# Patient Record
Sex: Female | Born: 1935 | Race: White | Hispanic: No | Smoking: Former smoker
Health system: Southern US, Community
[De-identification: ages and names within clinical notes are randomized; demographics above are authoritative.]

## PROBLEM LIST (undated history)

## (undated) DIAGNOSIS — J449 Chronic obstructive pulmonary disease, unspecified: Secondary | ICD-10-CM

## (undated) DIAGNOSIS — I10 Essential (primary) hypertension: Secondary | ICD-10-CM

## (undated) DIAGNOSIS — M1990 Unspecified osteoarthritis, unspecified site: Secondary | ICD-10-CM

## (undated) HISTORY — PX: ABDOMINAL HYSTERECTOMY: SHX81

---

## 2009-05-28 ENCOUNTER — Ambulatory Visit: Payer: Self-pay | Admitting: Family Medicine

## 2009-11-25 ENCOUNTER — Ambulatory Visit: Payer: Self-pay | Admitting: Diagnostic Radiology

## 2009-11-25 ENCOUNTER — Emergency Department (HOSPITAL_BASED_OUTPATIENT_CLINIC_OR_DEPARTMENT_OTHER): Admission: EM | Admit: 2009-11-25 | Discharge: 2009-11-25 | Payer: Self-pay | Admitting: Emergency Medicine

## 2009-11-25 ENCOUNTER — Ambulatory Visit: Payer: Self-pay | Admitting: Family Medicine

## 2009-11-25 DIAGNOSIS — I749 Embolism and thrombosis of unspecified artery: Secondary | ICD-10-CM | POA: Insufficient documentation

## 2009-11-25 DIAGNOSIS — I10 Essential (primary) hypertension: Secondary | ICD-10-CM | POA: Insufficient documentation

## 2009-11-26 ENCOUNTER — Encounter: Payer: Self-pay | Admitting: Family Medicine

## 2010-10-17 NOTE — Letter (Signed)
Summary: Internal Other  Internal Other   Imported By: Joanne Chars CMA 11/26/2009 12:52:45  _____________________________________________________________________  External Attachment:    Type:   Image     Comment:   External Document

## 2010-10-17 NOTE — Assessment & Plan Note (Signed)
Summary: VISIT/KH   Vital Signs:  Patient Profile:   77 Years Old Female CC:      vein on right leg edematous, "heavy,  red and warm X 2 days Height:     64.5 inches Weight:      164 pounds O2 Sat:      94 % O2 treatment:    Room Air Temp:     98.4 degrees F oral Pulse rate:   82 / minute Pulse rhythm:   regular Resp:     14 per minute BP sitting:   158 / 91  (right arm) Cuff size:   regular  Pt. in pain?   yes    Location:   right shin    Intensity:   5    Type:       heaviness  Vitals Entered By: Lajean Saver RN (November 25, 2009 10:23 AM)                   Updated Prior Medication List: LEXAPRO 10 MG TABS (ESCITALOPRAM OXALATE) as directed OMEPRAZOLE 20 MG CPDR (OMEPRAZOLE) as directed AMLODIPINE BESYLATE 5 MG TABS (AMLODIPINE BESYLATE) as directed HYDROCODONE-ACETAMINOPHEN 5-500 MG TABS (HYDROCODONE-ACETAMINOPHEN) prn * PROXICAN 10MG  once daily  Current Allergies (reviewed today): ! CODEINE ! LEVAQUINHistory of Present Illness Chief Complaint: vein on right leg edematous, "heavy,  red and warm X 2 days History of Present Illness: Subjective:  Patient complains of onset of swelling, heat, pain, and redness over her right lower leg pre-tibial area about 3 days ago.  The area has gradually become more painful.  No recent injury. No fever.  No chest pain or shortness of breath   REVIEW OF SYSTEMS Constitutional Symptoms      Denies fever, chills, night sweats, weight loss, weight gain, and fatigue.  Eyes       Denies change in vision, eye pain, eye discharge, glasses, contact lenses, and eye surgery. Ear/Nose/Throat/Mouth       Denies hearing loss/aids, change in hearing, ear pain, ear discharge, dizziness, frequent runny nose, frequent nose bleeds, sinus problems, sore throat, hoarseness, and tooth pain or bleeding.  Respiratory       Denies dry cough, productive cough, wheezing, shortness of breath, asthma, bronchitis, and emphysema/COPD.  Cardiovascular  Denies murmurs, chest pain, and tires easily with exhertion.    Gastrointestinal       Denies stomach pain, nausea/vomiting, diarrhea, constipation, blood in bowel movements, and indigestion. Genitourniary       Denies painful urination, kidney stones, and loss of urinary control. Neurological       Denies paralysis, seizures, and fainting/blackouts. Musculoskeletal       Complains of redness and swelling.      Denies muscle pain, joint pain, joint stiffness, decreased range of motion, muscle weakness, and gout.      Comments: right shin Skin       Denies bruising, unusual mles/lumps or sores, and hair/skin or nail changes.  Psych       Denies mood changes, temper/anger issues, anxiety/stress, speech problems, depression, and sleep problems. Other Comments: edematous and warm vein to right shin, sore and heavy   Past History:  Past Medical History:  Hypertension  Family History: elevated blood pressure Family History Hypertension  Social History: Alcohol use-no Drug use-no tobacco use - no, smoker X 15 years, quit 35 years ago   Objective:  No acute distress  Lungs:  Clear to auscultation.  Breath sounds are equal.  Heart:  Regular  rate and rhythm without murmurs, rubs, or gallops.  Abdomen:  Nontender without masses or hepatosplenomegaly.  Bowel sounds are present.  No CVA or flank tenderness.  Right lower leg pre-tibial area:  superficial varicosity with surrounding erythema, swelling, tenderness, and warmth.  The tenderness extends medially to the posterior calf where there is also tenderness to palpation.  Homan's test negative.   Assessment New Problems: VENOUS THROMBOSIS, SUPERFICIAL (ICD-453.9) HYPERTENSION (ICD-401.9)  SVT.  SUSPECT DVT AS WELL  Plan New Orders: Est. Patient Level III [13244] Planning Comments:   Patient referred to ER for further evaluation.  She agrees to proceed immediately.   The patient and/or caregiver has been counseled thoroughly  with regard to medications prescribed including dosage, schedule, interactions, rationale for use, and possible side effects and they verbalize understanding.  Diagnoses and expected course of recovery discussed and will return if not improved as expected or if the condition worsens. Patient and/or caregiver verbalized understanding.

## 2010-10-17 NOTE — Letter (Signed)
Summary: Transfer Report  Transfer Report   Imported By: Junius Finner 11/25/2009 11:07:57  _____________________________________________________________________  External Attachment:    Type:   Image     Comment:   External Document

## 2010-11-30 ENCOUNTER — Encounter: Payer: Self-pay | Admitting: Family Medicine

## 2010-11-30 ENCOUNTER — Other Ambulatory Visit: Payer: Self-pay | Admitting: Family Medicine

## 2010-11-30 ENCOUNTER — Ambulatory Visit
Admission: RE | Admit: 2010-11-30 | Discharge: 2010-11-30 | Disposition: A | Discharge status: Home / Self Care | Payer: Medicare Other | Source: Ambulatory Visit | Attending: Family Medicine | Admitting: Family Medicine

## 2010-11-30 ENCOUNTER — Inpatient Hospital Stay (INDEPENDENT_AMBULATORY_CARE_PROVIDER_SITE_OTHER)
Admission: RE | Admit: 2010-11-30 | Discharge: 2010-11-30 | Disposition: A | Discharge status: Home / Self Care | Payer: Medicare Other | Source: Ambulatory Visit | Attending: Family Medicine | Admitting: Family Medicine

## 2010-11-30 DIAGNOSIS — J069 Acute upper respiratory infection, unspecified: Secondary | ICD-10-CM

## 2010-12-01 ENCOUNTER — Encounter: Payer: Self-pay | Admitting: Family Medicine

## 2010-12-02 ENCOUNTER — Telehealth (INDEPENDENT_AMBULATORY_CARE_PROVIDER_SITE_OTHER): Payer: Self-pay | Admitting: *Deleted

## 2010-12-05 NOTE — Assessment & Plan Note (Signed)
Summary: COUGH/WEAKNESS (rm 3)   Vital Signs:  Patient Profile:   77 Years Old Female CC:      cough, congestion, hoarse, fever Height:     64.5 inches Weight:      160 pounds O2 Sat:      96 % O2 treatment:    Room Air Temp:     98.5 degrees F oral Pulse rate:   91 / minute Resp:     18 per minute BP sitting:   155 / 84  (left arm) Cuff size:   regular  Vitals Entered By: Lajean Saver RN (November 30, 2010 11:00 AM)                  Updated Prior Medication List: LEXAPRO 10 MG TABS (ESCITALOPRAM OXALATE) as directed OMEPRAZOLE 20 MG CPDR (OMEPRAZOLE) as directed AMLODIPINE BESYLATE 5 MG TABS (AMLODIPINE BESYLATE) as directed HYDROCODONE-ACETAMINOPHEN 5-500 MG TABS (HYDROCODONE-ACETAMINOPHEN) prn * PROXICAN 10MG  once daily  Current Allergies (reviewed today): ! CODEINE ! LEVAQUINHistory of Present Illness Chief Complaint: cough, congestion, hoarse, fever History of Present Illness:  Subjective: Patient complains of URI symptoms that started 4 days ago with a cough.  She has had pneumonia in the past.  She has had a seasonal flu shot No sore throat. No pleuritic pain ? wheezing + nasal congestion ? post-nasal drainage No sinus pain/pressure No itchy/red eyes No earache No hemoptysis + SOB No fever, + chills No nausea No vomiting No abdominal pain No diarrhea No skin rashes + fatigue + myalgias No headache Used OTC meds without relief   REVIEW OF SYSTEMS Constitutional Symptoms      Denies fever, chills, night sweats, weight loss, weight gain, and fatigue.  Eyes       Denies change in vision, eye pain, eye discharge, glasses, contact lenses, and eye surgery. Ear/Nose/Throat/Mouth       Complains of sinus problems and hoarseness.      Denies hearing loss/aids, change in hearing, ear pain, ear discharge, dizziness, frequent runny nose, frequent nose bleeds, sore throat, and tooth pain or bleeding.  Respiratory       Complains of productive cough,  wheezing, and shortness of breath.      Denies dry cough, asthma, bronchitis, and emphysema/COPD.  Cardiovascular       Denies murmurs, chest pain, and tires easily with exhertion.    Gastrointestinal       Denies stomach pain, nausea/vomiting, diarrhea, constipation, blood in bowel movements, and indigestion. Genitourniary       Denies painful urination, kidney stones, and loss of urinary control. Neurological       Denies paralysis, seizures, and fainting/blackouts. Musculoskeletal       Denies muscle pain, joint pain, joint stiffness, decreased range of motion, redness, swelling, muscle weakness, and gout.  Skin       Denies bruising, unusual mles/lumps or sores, and hair/skin or nail changes.  Psych       Denies mood changes, temper/anger issues, anxiety/stress, speech problems, depression, and sleep problems. Other Comments: Taken Mucinex OTC   Past History:  Past Medical History: Reviewed history from 11/25/2009 and no changes required.  Hypertension  Past Surgical History: Reviewed history from 05/28/2009 and no changes required. hysterectomy ear surgery  Family History: Reviewed history from 11/25/2009 and no changes required. elevated blood pressure Family History Hypertension  Social History: Reviewed history from 11/25/2009 and no changes required. Alcohol use-no Drug use-no tobacco use - no, smoker X 15 years, quit 35 years  ago   Objective:  No acute distress  Eyes:  Pupils are equal, round, and reactive to light and accomdation.  Extraocular movement is intact.  Conjunctivae are not inflamed.  Ears:  Canals normal.  Tympanic membranes normal.   Nose:  Mildly congested; no sinus tenderness Pharynx:  Normal  Neck:  Supple.  No adenopathy is present. Lungs:   Rales right posterior base.  Breath sounds are equal.  Heart:  Regular rate and rhythm without murmurs, rubs, or gallops.  Abdomen:  Nontender without masses or hepatosplenomegaly.  Bowel sounds are  present.  No CVA or flank tenderness.  Extremities:  No edema.   CBC:  WBC 6.9; normal diff Chest X-ray:   IMPRESSION: No active disease.  Borderline cardiomegaly. Assessment New Problems: RESPIRATORY DISORDER, ACUTE (ICD-465.9)   Plan New Medications/Changes: BENZONATATE 200 MG CAPS (BENZONATATE) One by mouth hs as needed cough  #14 x 0, 11/30/2010, Donna Christen MD AMOXICILLIN 875 MG TABS (AMOXICILLIN) One by mouth two times a day  #20 x 0, 11/30/2010, Donna Christen MD  New Orders: CBC w/Diff [[id number]] T-Chest x-ray, 2 views [71020] Pulse Oximetry (single measurment) [94760] Est. Patient Level III [09811] Planning Comments:   With a history of pneumonia will begin amoxicillin, expectorant, topical decongestant, cough suppressant at bedtime.  Increase fluid intake Followup with PCP if not improving 7 to 10 days   The patient and/or caregiver has been counseled thoroughly with regard to medications prescribed including dosage, schedule, interactions, rationale for use, and possible side effects and they verbalize understanding.  Diagnoses and expected course of recovery discussed and will return if not improved as expected or if the condition worsens. Patient and/or caregiver verbalized understanding.  Prescriptions: BENZONATATE 200 MG CAPS (BENZONATATE) One by mouth hs as needed cough  #14 x 0   Entered and Authorized by:   Donna Christen MD   Signed by:   Donna Christen MD on 11/30/2010   Method used:   Print then Give to Patient   RxID:   [id number] AMOXICILLIN 875 MG TABS (AMOXICILLIN) One by mouth two times a day  #20 x 0   Entered and Authorized by:   Donna Christen MD   Signed by:   Donna Christen MD on 11/30/2010   Method used:   Print then Give to Patient   RxID:   [id number]   Patient Instructions: 1)  Take Mucinex  (guaifenesin) twice daily for congestion. 2)  Increase fluid intake, rest. 3)  May use Afrin nasal spray (or generic oxymetazoline) twice  daily for about 5 days.  Also recommend using saline nasal spray several times daily and/or saline nasal irrigation. 4)  Followup with family doctor if not improving 7 to 10 days.   Orders Added: 1)  CBC w/Diff [[id number]] 2)  T-Chest x-ray, 2 views [71020] 3)  Pulse Oximetry (single measurment) [94760] 4)  Est. Patient Level III [64403]

## 2010-12-05 NOTE — Progress Notes (Signed)
  Phone Note Outgoing Call   Call placed by: Clemens Catholic LPN,  December 02, 2010 3:09 PM Call placed to: Patient Summary of Call: call back: pt states that she is feeling a lot better. advised her to complete ABT and follow up wth PCP if she fails to continue to improve. pt agrees. Initial call taken by: Clemens Catholic LPN,  December 02, 2010 3:09 PM

## 2010-12-11 LAB — BASIC METABOLIC PANEL
BUN: 14 mg/dL (ref 6–23)
Creatinine, Ser: 1 mg/dL (ref 0.4–1.2)
GFR calc Af Amer: >60 mL/min (ref >60)
GFR calc non Af Amer: 54 mL/min — ABNORMAL LOW (ref >60)
Glucose, Bld: 89 mg/dL (ref 70–99)

## 2010-12-11 LAB — CULTURE, BLOOD (ROUTINE X 2): Culture: NO GROWTH

## 2010-12-11 LAB — DIFFERENTIAL
Basophils Absolute: 0 10*3/uL (ref 0.0–0.1)
Eosinophils Relative: 5 % (ref 0–5)
Lymphs Abs: 0.6 10*3/uL — ABNORMAL LOW (ref 0.7–4.0)
Monocytes Absolute: 0.4 10*3/uL (ref 0.1–1.0)
Neutrophils Relative %: 62 % (ref 43–77)

## 2010-12-11 LAB — CBC
MCV: 86 fL (ref 78.0–100.0)
WBC: 3.1 10*3/uL — ABNORMAL LOW (ref 4.0–10.5)

## 2011-04-15 IMAGING — US US EXTREM LOW VENOUS*R*
1 series · 14 of 24 positions shown · non-contrast
Comparison: None.

CLINICAL DATA: Medial calf redness, swelling and pain.  Rope-like
area in the upper calf.  Possible DVT.

RIGHT LOWER EXTREMITY VENOUS DUPLEX ULTRASOUND
TECHNIQUE: Gray-scale sonography with graded compression, as well
as color Doppler and duplex ultrasound, were performed to evaluate
the deep venous system of the lower extremity from the level of the
common femoral vein through the popliteal and proximal calf veins.
Spectral Doppler was utilized to evaluate flow at rest and with
distal augmentation maneuvers.

[Series 1: us extrem low venous*right* · 14 of 33 slices shown]
[im 1/33]
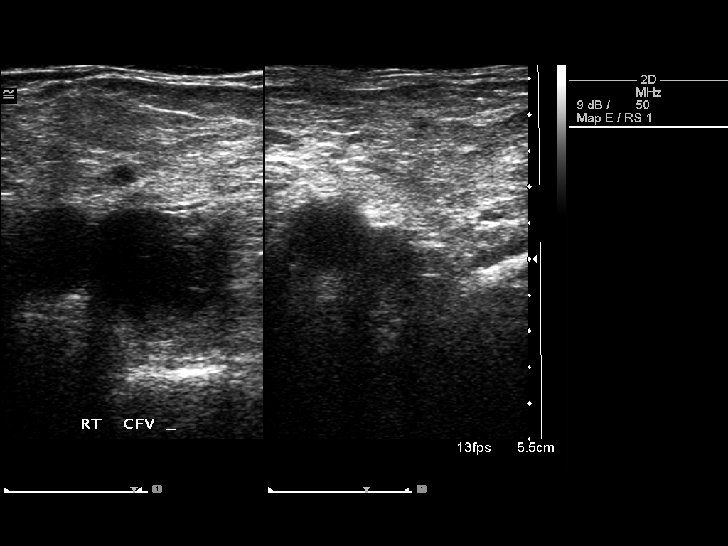
[im 3/33]
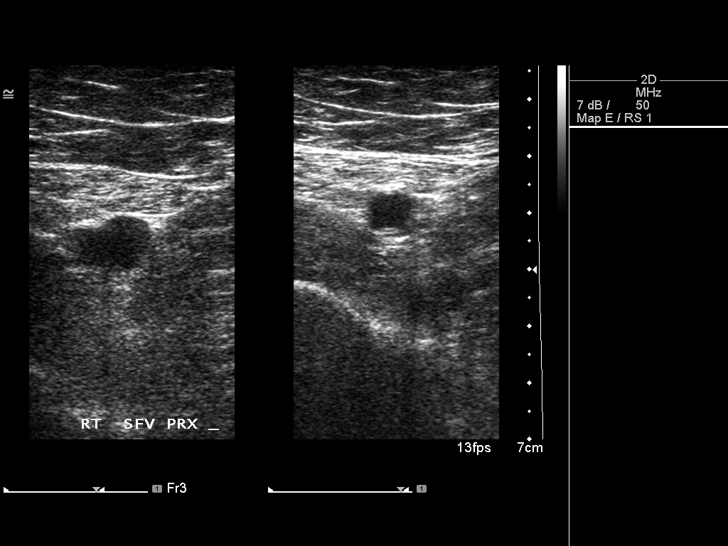
[im 6/33]
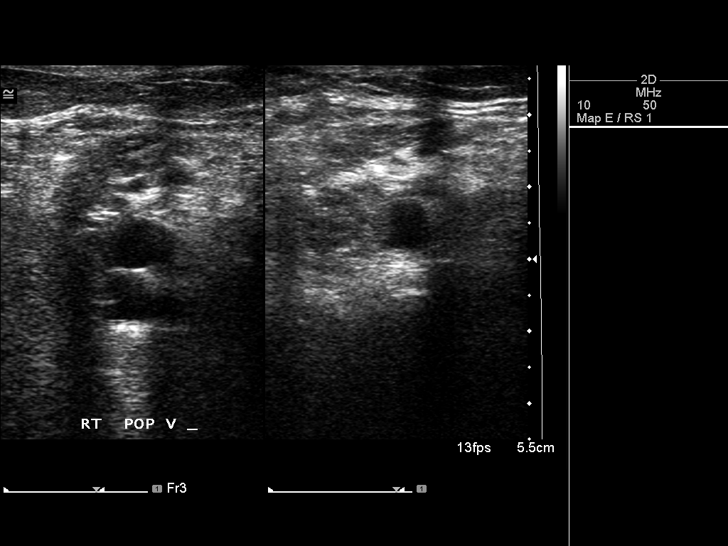
[im 9/33]
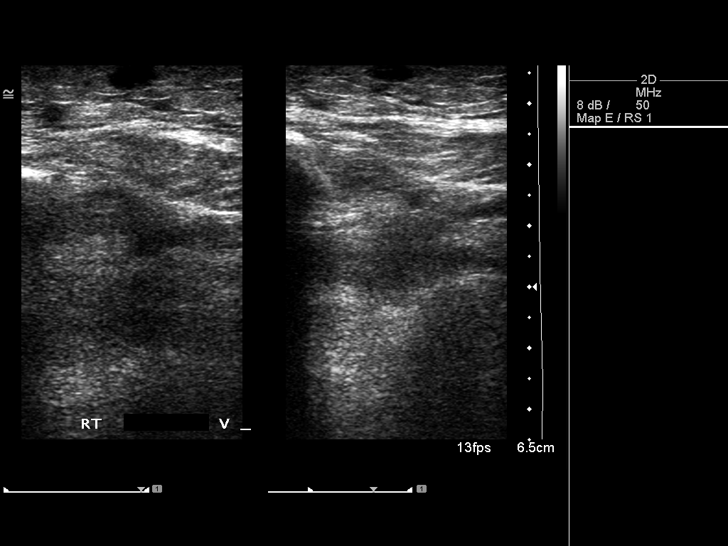
[im 10/33]
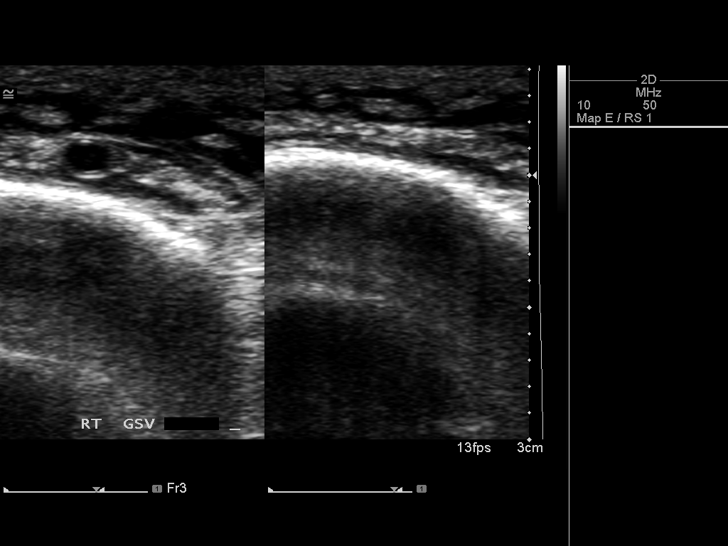
[im 13/33]
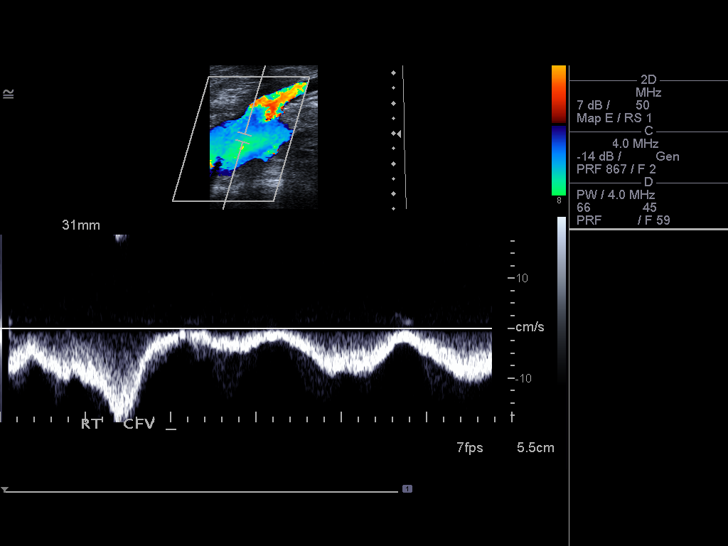
[im 16/33]
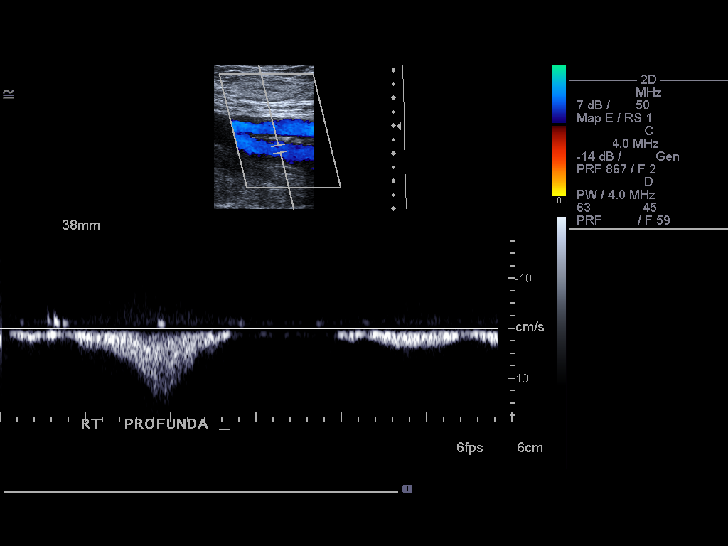
[im 17/33]
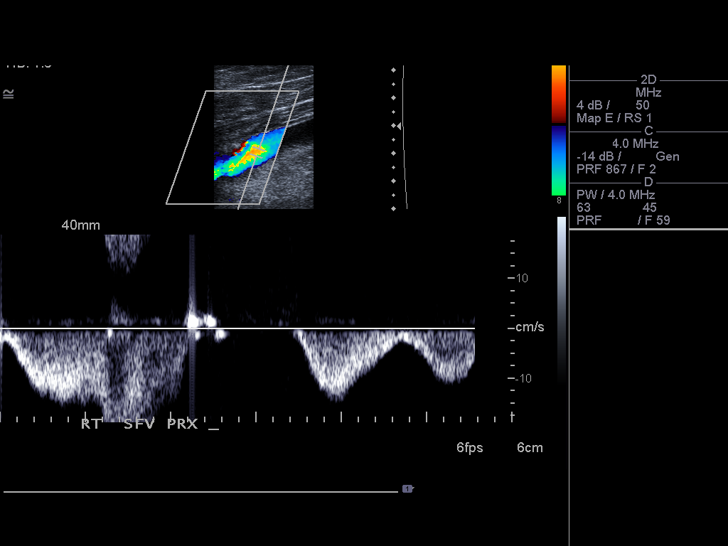
[im 20/33]
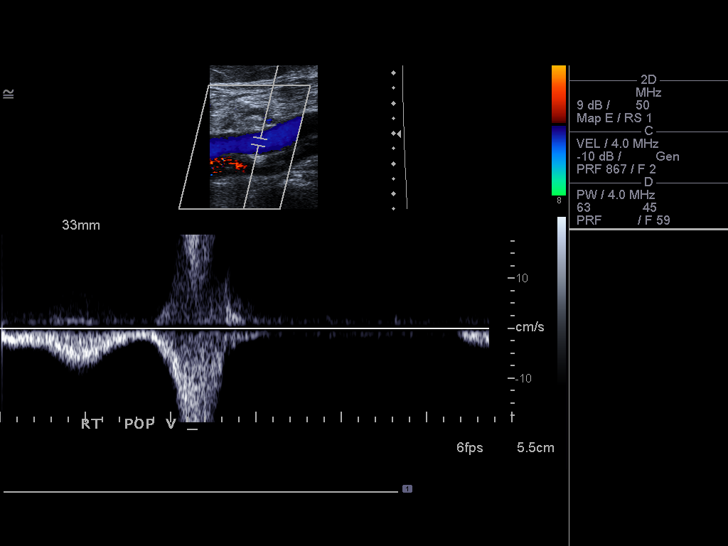
[im 23/33]
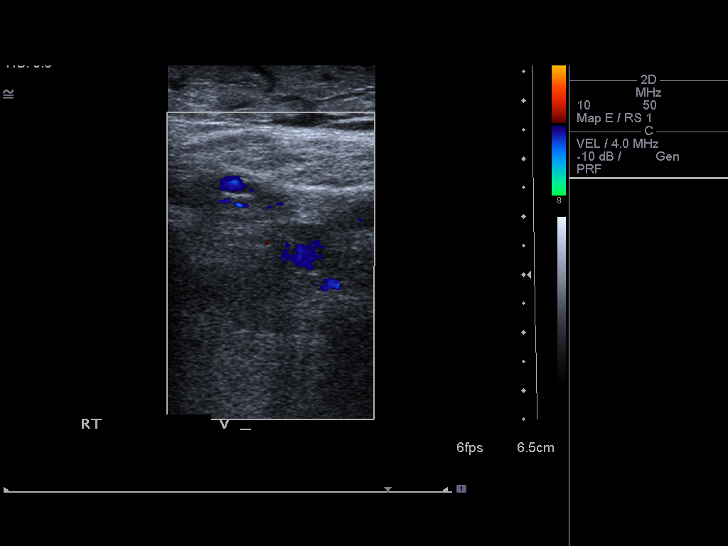
[im 26/33]
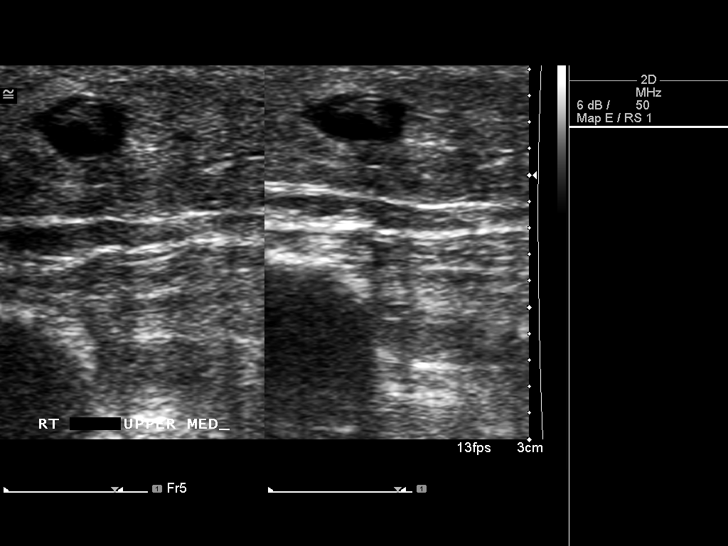
[im 27/33]
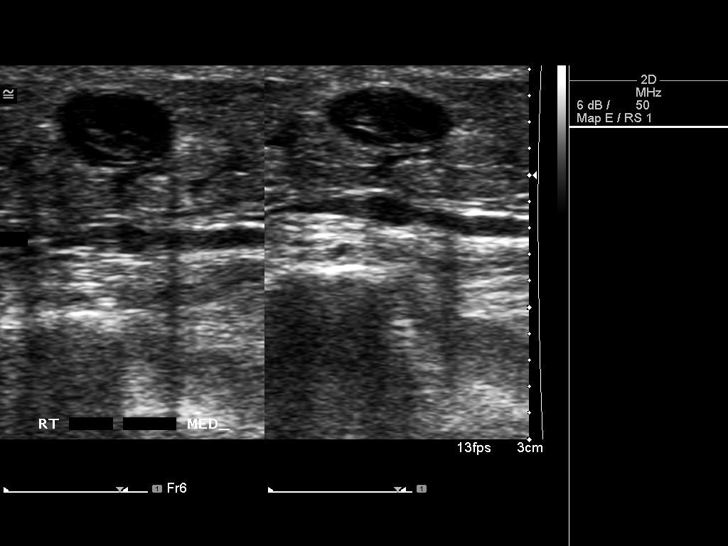
[im 30/33]
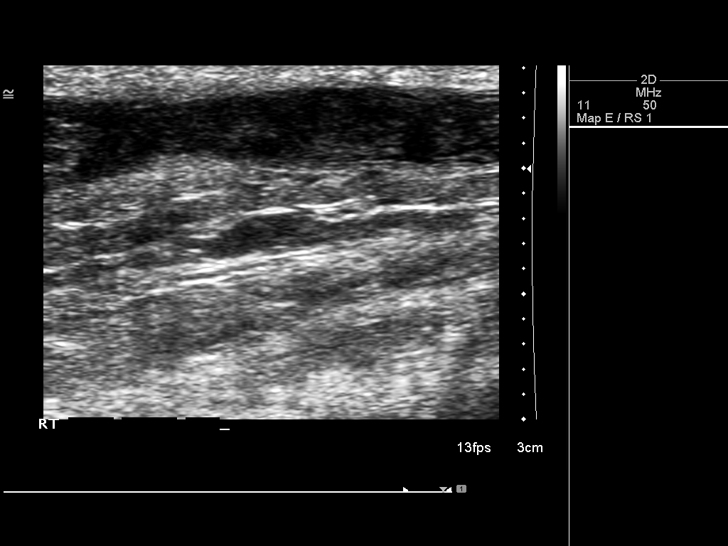
[im 33/33]
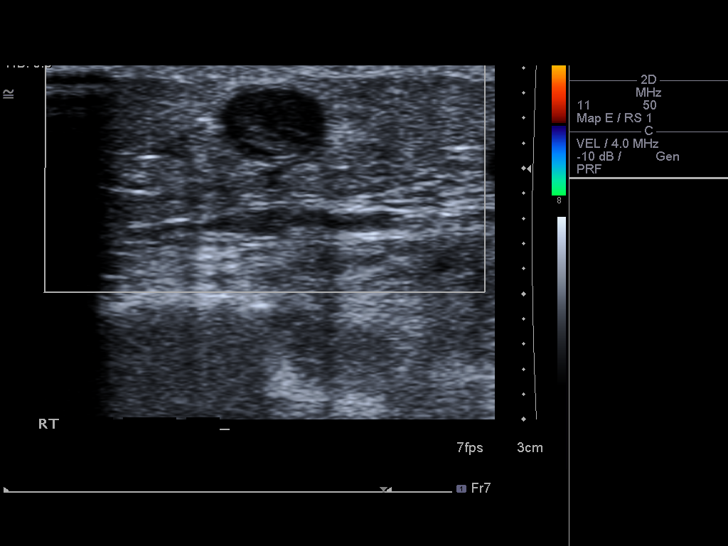

[14 of 24 positions shown; findings below may reference images not displayed]

FINDINGS: There is normal flow, compressibility and augmentation in
the right common femoral, profunda femoral, femoral and popliteal
veins.  Within the calf portion of the greater saphenous vein,
there is echogenic material within, and the vein will not compress.
Thrombus extends into a superficial branch off the greater
saphenous vein.
IMPRESSION: 1.  No evidence of deep venous thrombosis in the right lower
extremity.
2.  Superficial thrombophlebitis in the calf, involving the greater
saphenous vein.

## 2011-08-24 ENCOUNTER — Emergency Department: Admit: 2011-08-24 | Discharge: 2011-08-24 | Disposition: A | Discharge status: Home / Self Care | Payer: Medicare Other

## 2011-08-24 ENCOUNTER — Encounter: Payer: Self-pay | Admitting: *Deleted

## 2011-08-24 ENCOUNTER — Emergency Department (INDEPENDENT_AMBULATORY_CARE_PROVIDER_SITE_OTHER)
Admission: EM | Admit: 2011-08-24 | Discharge: 2011-08-24 | Disposition: A | Discharge status: Home / Self Care | Payer: Medicare Other | Source: Home / Self Care | Attending: Family Medicine | Admitting: Family Medicine

## 2011-08-24 DIAGNOSIS — R0989 Other specified symptoms and signs involving the circulatory and respiratory systems: Secondary | ICD-10-CM

## 2011-08-24 DIAGNOSIS — R0609 Other forms of dyspnea: Secondary | ICD-10-CM

## 2011-08-24 HISTORY — DX: Essential (primary) hypertension: I10

## 2011-08-24 HISTORY — DX: Chronic obstructive pulmonary disease, unspecified: J44.9

## 2011-08-24 HISTORY — DX: Unspecified osteoarthritis, unspecified site: M19.90

## 2011-08-24 LAB — POCT CBC W AUTO DIFF (K'VILLE URGENT CARE)

## 2011-08-24 MED ORDER — ALBUTEROL SULFATE HFA 108 (90 BASE) MCG/ACT IN AERS: 2.0000 | INHALATION_SPRAY | RESPIRATORY_TRACT | Status: AC | PRN

## 2011-08-24 MED ORDER — HYDROCHLOROTHIAZIDE 12.5 MG PO TABS: 12.5000 mg | ORAL_TABLET | ORAL | Status: AC

## 2011-08-24 NOTE — ED Notes (Signed)
Patient c/o dry cough and SOB x 3 days. 02 sat 86%. Placed on 1LNC, 02 increased to 95%.

## 2011-08-24 NOTE — ED Provider Notes (Signed)
History     CSN: [id number] Arrival date & time: 08/24/2011  1:19 PM   First MD Initiated Contact with Patient 08/24/11 1341      Chief Complaint  Patient presents with  . Cough  . Shortness of Breath     Patient is a 77 y.o. female presenting with shortness of breath. The history is provided by the patient.  Shortness of Breath  Episode onset: Several months. The onset was gradual. The problem occurs frequently. The problem has been gradually worsening. The problem is mild. The symptoms are relieved by rest. The symptoms are aggravated by activity. Associated symptoms include cough, shortness of breath and wheezing. Pertinent negatives include no chest pain, no chest pressure, no orthopnea, no fever, no rhinorrhea, no sore throat and no stridor.    Past Medical History  Diagnosis Date  . Hypertension   . COPD (chronic obstructive pulmonary disease)   . Arthritis     Past Surgical History  Procedure Date  . Abdominal hysterectomy     No family history on file.  History  Substance Use Topics  . Smoking status: Former Smoker -- 10 years    Types: Cigarettes    Quit date: 08/24/1971  . Smokeless tobacco: Not on file  . Alcohol Use: No    OB History    Grav Para Term Preterm Abortions TAB SAB Ect Mult Living                  Review of Systems  Constitutional: Positive for activity change, fatigue and unexpected weight change. Negative for fever, chills and appetite change.       Patient has noted a 10 pound increase in weight recently.  HENT: Negative.  Negative for sore throat and rhinorrhea.   Eyes: Negative.   Respiratory: Positive for cough, shortness of breath and wheezing. Negative for chest tightness and stridor.        She notes that she uses CPAP at night.  Her wheezing is improved with albuterol inhaler. For past several days has had increased non productive cough.  Cardiovascular: Positive for leg swelling. Negative for chest pain and orthopnea.   Leg swelling is worse in evenings.  Gastrointestinal: Negative.   Genitourinary: Negative.   Musculoskeletal: Negative.   Skin: Negative.   Neurological: Negative.     Allergies  Codeine and Levofloxacin  Home Medications   Current Outpatient Rx  Name Route Sig Dispense Refill  . AMLODIPINE BESYLATE 5 MG PO TABS Oral Take 5 mg by mouth daily.      Marland Kitchen ESCITALOPRAM OXALATE 10 MG PO TABS Oral Take 10 mg by mouth daily.      . MELOXICAM 15 MG PO TABS Oral Take 15 mg by mouth daily.      Marland Kitchen OMEPRAZOLE 20 MG PO CPDR Oral Take 20 mg by mouth daily.      . ALBUTEROL SULFATE HFA 108 (90 BASE) MCG/ACT IN AERS Inhalation Inhale 2 puffs into the lungs every 4 (four) hours as needed for wheezing. 1 Inhaler 0  . HYDROCHLOROTHIAZIDE 12.5 MG PO TABS Oral Take 1 tablet (12.5 mg total) by mouth every morning. 15 tablet 1    BP 172/83  Pulse 79  Temp(Src) 98.3 F (36.8 C) (Oral)  Resp 14  Ht 5\' 6"  (1.676 m)  Wt 164 lb (74.39 kg)  BMI 26.47 kg/m2  SpO2 86%  Physical Exam  Nursing note and vitals reviewed. Constitutional: She is oriented to person, place, and time. She appears well-developed  and well-nourished. No distress.  HENT:  Head: Normocephalic.  Right Ear: External ear normal.  Left Ear: External ear normal.  Nose: Nose normal.  Mouth/Throat: Oropharynx is clear and moist.  Eyes: Conjunctivae and EOM are normal. Pupils are equal, round, and reactive to light.  Neck: Neck supple. No JVD present.  Cardiovascular: Normal rate, regular rhythm and normal heart sounds.  Exam reveals no gallop and no friction rub.   No murmur heard. Pulmonary/Chest: Effort normal. No respiratory distress. She has no wheezes. She has rales.       Faint bibasilar rales posteriorly  Abdominal: Soft. Bowel sounds are normal. There is no tenderness.  Musculoskeletal: She exhibits edema.       Trace lower leg edema present.  Lymphadenopathy:    She has no cervical adenopathy.  Neurological: She is alert and  oriented to person, place, and time.  Skin: Skin is warm and dry. No rash noted.  Psychiatric: She has a normal mood and affect.    ED Course  Procedures none  Labs Reviewed - CBC:  WBC 5.3; LY 24.2; MO 10.5; GR 65.3; Hgb 13.0      DG Chest 2 View (Final result)   Result time:08/24/11 1428    Final result by Rad Results In Interface (08/24/11 14:28:15)    Narrative:   *RADIOLOGY REPORT*  Clinical Data: Increased shortness of breath on exertion  CHEST - 2 VIEW  Comparison: Chest x-ray of 11/30/2010  Findings: No active infiltrate or effusion is seen. Probable mild scarring at the lung bases appears stable. Cardiomegaly is stable. No acute bony abnormality is seen.  IMPRESSION: Stable cardiomegaly. No active lung disease.  Original Report Authenticated By: Juline Patch, M.D.      1. Dyspnea on exertion       MDM  Suspect mild CHF.  There is no evidence of bacterial infection today.   Begin trial of low dose diuretic:  HCTZ 12.5mg  QAM Minimize physical activity that causes shortness of breath.  Continue CPAP at night. Resume albuterol inhaler. Weigh daily and record. Followup with family doctor within one week. If symptoms become significantly worse during the night or over the weekend, proceed to the local emergency room.       Donna Christen, MD 08/26/11 2135

## 2012-05-24 ENCOUNTER — Encounter: Payer: Self-pay | Admitting: *Deleted

## 2012-05-24 ENCOUNTER — Emergency Department (INDEPENDENT_AMBULATORY_CARE_PROVIDER_SITE_OTHER)
Admission: EM | Admit: 2012-05-24 | Discharge: 2012-05-24 | Disposition: A | Discharge status: Home / Self Care | Payer: Medicare Other | Source: Home / Self Care | Attending: Emergency Medicine | Admitting: Emergency Medicine

## 2012-05-24 DIAGNOSIS — B029 Zoster without complications: Secondary | ICD-10-CM

## 2012-05-24 MED ORDER — VALACYCLOVIR HCL 1 G PO TABS: ORAL_TABLET | ORAL | Status: AC

## 2012-05-24 NOTE — ED Notes (Signed)
Pt noticed rash on lower L back last night.  Described as uncomfortable, itches

## 2012-05-24 NOTE — ED Provider Notes (Signed)
History     CSN: [id number]  Arrival date & time 05/24/12  1425   First MD Initiated Contact with Patient 05/24/12 1429      Chief Complaint  Patient presents with  . Herpes Zoster    Patient is a 78 y.o. female presenting with rash. The history is provided by the patient.  Rash  This is a new problem. The current episode started 12 to 24 hours ago. The problem has been gradually worsening. The problem is associated with nothing. There has been no fever. The rash is present on the torso (Left posterior mid back). The pain is at a severity of 1/10. The pain has been constant since onset. Associated symptoms include blisters and itching (Minimal). Pertinent negatives include no weeping. She has tried nothing for the symptoms. Risk factors: She once had shingles about 10 years ago that resolved, and she feels this might be shingles..-she's never had the shingles vaccine. She denies any new environmental exposures or new medications.    Past Medical History  Diagnosis Date  . Hypertension   . COPD (chronic obstructive pulmonary disease)   . Arthritis     Past Surgical History  Procedure Date  . Abdominal hysterectomy     History reviewed. No pertinent family history.  History  Substance Use Topics  . Smoking status: Former Smoker -- 10 years    Types: Cigarettes    Quit date: 08/24/1971  . Smokeless tobacco: Not on file  . Alcohol Use: No    OB History    Grav Para Term Preterm Abortions TAB SAB Ect Mult Living                  Review of Systems  Constitutional: Negative for fever, chills and diaphoresis.  HENT: Negative.   Eyes: Negative.   Respiratory: Negative.   Cardiovascular: Negative.   Gastrointestinal: Negative.   Genitourinary: Negative.   Musculoskeletal: Negative.  Negative for joint swelling.  Skin: Positive for itching (Minimal) and rash. Negative for wound.  Neurological: Negative.   Hematological: Negative.   Psychiatric/Behavioral: Negative.     All other systems reviewed and are negative.    Allergies  Codeine; Hydrocodone; Hydromet; Levofloxacin; and Norco  Home Medications   Current Outpatient Rx  Name Route Sig Dispense Refill  . ALBUTEROL SULFATE HFA 108 (90 BASE) MCG/ACT IN AERS Inhalation Inhale 2 puffs into the lungs every 4 (four) hours as needed for wheezing. 1 Inhaler 0  . AMLODIPINE BESYLATE 5 MG PO TABS Oral Take 5 mg by mouth daily.      Marland Kitchen ESCITALOPRAM OXALATE 10 MG PO TABS Oral Take 10 mg by mouth daily.      Marland Kitchen HYDROCHLOROTHIAZIDE 12.5 MG PO TABS Oral Take 1 tablet (12.5 mg total) by mouth every morning. 15 tablet 1  . MELOXICAM 15 MG PO TABS Oral Take 15 mg by mouth daily.      Marland Kitchen OMEPRAZOLE 20 MG PO CPDR Oral Take 20 mg by mouth daily.      Marland Kitchen VALACYCLOVIR HCL 1 G PO TABS  Take one by mouth 3 times a day for 7 days 21 tablet 0    BP 129/75  Pulse 66  Temp 98.4 F (36.9 C) (Oral)  Resp 14  Ht 5\' 5"  (1.651 m)  Wt 161 lb 8 oz (73.256 kg)  BMI 26.88 kg/m2  SpO2 92%  Physical Exam  Nursing note and vitals reviewed. Constitutional: She is oriented to person, place, and time. She appears well-developed  and well-nourished. No distress.  HENT:  Head: Normocephalic and atraumatic.  Eyes: Conjunctivae and EOM are normal. Pupils are equal, round, and reactive to light. No scleral icterus.  Neck: Normal range of motion.  Cardiovascular: Normal rate.   Pulmonary/Chest: Effort normal.  Abdominal: She exhibits no distension.  Musculoskeletal: Normal range of motion.  Neurological: She is alert and oriented to person, place, and time.  Skin: Skin is warm. Rash noted. Rash is maculopapular (Erythematous) and vesicular. Rash is not nodular, not pustular and not urticarial.     Psychiatric: She has a normal mood and affect.    ED Course  Procedures (including critical care time)  Labs Reviewed - No data to display No results found.   1. Shingles       MDM  Shingles. Given that the rash started less  than 24 hours ago, she is a candidate for Valtrex, which can likely be effective in shortening the episode and decreasing chance of postherpetic neuralgia. After risks, benefits, alternatives discussed, patient agrees with starting Valtrex 1000 mg by mouth 3 times a day x7 days. Information sheet given on shingles, given to patient. Other treatment modalities discussed. Questions invited and answered. She declined any prescription pain medication, but may use Tylenol when necessary pain. Followup with PCP. I urged her to discuss with her PCP whether or not to get the shingles vaccine, and I suggested to her to get the shingles vaccine after this episode resolves.        Lajean Manes, MD 05/24/12 1517

## 2013-07-14 ENCOUNTER — Emergency Department (INDEPENDENT_AMBULATORY_CARE_PROVIDER_SITE_OTHER): Payer: Medicare Other

## 2013-07-14 ENCOUNTER — Emergency Department (INDEPENDENT_AMBULATORY_CARE_PROVIDER_SITE_OTHER)
Admission: EM | Admit: 2013-07-14 | Discharge: 2013-07-14 | Disposition: A | Discharge status: Home / Self Care | Payer: Medicare Other | Source: Home / Self Care | Attending: Family Medicine | Admitting: Family Medicine

## 2013-07-14 ENCOUNTER — Encounter: Payer: Self-pay | Admitting: Emergency Medicine

## 2013-07-14 DIAGNOSIS — M439 Deforming dorsopathy, unspecified: Secondary | ICD-10-CM

## 2013-07-14 DIAGNOSIS — M549 Dorsalgia, unspecified: Secondary | ICD-10-CM

## 2013-07-14 DIAGNOSIS — I6789 Other cerebrovascular disease: Secondary | ICD-10-CM

## 2013-07-14 DIAGNOSIS — I10 Essential (primary) hypertension: Secondary | ICD-10-CM

## 2013-07-14 NOTE — ED Notes (Signed)
Pt c/o RT sided mid back pain x 2 days, after digging with a shovel at home. She has used a heating pad with minimal relief.

## 2013-07-14 NOTE — ED Provider Notes (Addendum)
CSN: [id number]     Arrival date & time 07/14/13  1041 History   First MD Initiated Contact with Patient 07/14/13 1048     Chief Complaint  Patient presents with  . Back Pain    HPI  Patient presents today with chief complaint of mid back pain. However, patient is known to have markedly elevated blood pressures in the setting of dizziness. Patient has a baseline history of poorly controlled blood pressure. Patient states her pressure usually runs in the 170s to 180s. Has been in some pain. Has been dizzy since this morning. Worse with standing. No chest pains or shortness of breath. Also has mid to low back pain status post a work in her garden. Patient does report using some hypertrophic earlier today. Patient is unclear of her cardiac history, though denies a history of a heart attack. No hemiparesis or confusion. No recent trauma.  Past Medical History  Diagnosis Date  . Hypertension   . COPD (chronic obstructive pulmonary disease)   . Arthritis    Past Surgical History  Procedure Laterality Date  . Abdominal hysterectomy     History reviewed. No pertinent family history. History  Substance Use Topics  . Smoking status: Former Smoker -- 10 years    Types: Cigarettes    Quit date: 08/24/1971  . Smokeless tobacco: Not on file  . Alcohol Use: No   OB History   Grav Para Term Preterm Abortions TAB SAB Ect Mult Living                 Review of Systems  All other systems reviewed and are negative.    Allergies  Codeine; Hydrocodone; Hydromet; Levofloxacin; and Norco  Home Medications   Current Outpatient Rx  Name  Route  Sig  Dispense  Refill  . diltiazem (CARDIZEM) 120 MG tablet   Oral   Take 120 mg by mouth 4 (four) times daily.         Marland Kitchen tiotropium (SPIRIVA) 18 MCG inhalation capsule   Inhalation   Place 18 mcg into inhaler and inhale daily.         Marland Kitchen EXPIRED: albuterol (PROVENTIL HFA;VENTOLIN HFA) 108 (90 BASE) MCG/ACT inhaler   Inhalation  Inhale 2 puffs into the lungs every 4 (four) hours as needed for wheezing.   1 Inhaler   0   . amLODipine (NORVASC) 5 MG tablet   Oral   Take 5 mg by mouth daily.           Marland Kitchen escitalopram (LEXAPRO) 10 MG tablet   Oral   Take 10 mg by mouth daily.           Marland Kitchen EXPIRED: hydrochlorothiazide (HYDRODIURIL) 12.5 MG tablet   Oral   Take 1 tablet (12.5 mg total) by mouth every morning.   15 tablet   1   . meloxicam (MOBIC) 15 MG tablet   Oral   Take 15 mg by mouth daily.           Marland Kitchen omeprazole (PRILOSEC) 20 MG capsule   Oral   Take 20 mg by mouth daily.            BP 206/105  Pulse 75  Temp(Src) 98 F (36.7 C) (Oral)  Resp 18  Wt 139 lb (63.05 kg)  BMI 23.13 kg/m2  SpO2 98% Physical Exam  Constitutional:  Elderly   HENT:  Head: Normocephalic and atraumatic.  Eyes: Conjunctivae are normal. Pupils are equal, round, and reactive to light.  Neck: Normal range of motion. Neck supple.  Cardiovascular: Normal rate and regular rhythm.   Pulmonary/Chest: Effort normal and breath sounds normal.  Abdominal: Soft. Bowel sounds are normal.  Musculoskeletal:       Arms: Neurological: She is alert. No cranial nerve deficit. Coordination normal.    ED Course  Procedures (including critical care time) Labs Review Labs Reviewed - No data to display Imaging Review Dg Thoracic Spine 2 View  07/14/2013   CLINICAL DATA:  Back pain  EXAM: THORACIC SPINE - 2 VIEW  COMPARISON:  08/24/2011  FINDINGS: Mild degenerative changes are noted. A mild compression deformity is seen in the thoracolumbar junction which is stable from the prior exam. No acute compression deformity is seen. The pedicles are within normal limits and no paraspinal mass lesion is noted. The patient is somewhat rotated to the right accentuating the mediastinal markings.  IMPRESSION: Chronic compression deformity. No acute abnormality is seen.   Electronically Signed   By: Alcide Clever M.D.   On: 07/14/2013 12:08     EKG: sinus rhythm, no acute ST or T wave abnormalities, low voltage changes   MDM   1. Back pain   2. Accelerated hypertension    Suspect the elevated blood pressures likely secondary to thoracic back pain. No acute changes on imaging. EKG today also within normal limits. Given the patient has had some episodes of dizziness there is some concern for accelerated hypertension/hypertensive urgency. Patient stated that dizziness is a recurrent issue. Had a fairly lengthy discussion with patient in terms of further evaluation of high blood pressure. Patient initially did not want to go to the hospital for this, is refusing EMS transfer. Checked head CT today that was within normal limits apart from chronic ischemic changes. Check orthostatics to which patient became significantly dizzy. At this point, patient became agreeable to go to the ER for further evaluation. Will send patient to the ER via EMS for hypertensive urgency with some concern of hypertensive emergency. Full dose aspirin given no hemorrhagic changes on CT prior to leaving clinic.  Greater than 50% of 60 minutes was spent with patient in terms of direct patient care and care coordination.     Doree Albee, MD 07/14/13 1307  Doree Albee, MD 07/14/13 1307  Doree Albee, MD 07/14/13 1308  Doree Albee, MD 07/14/13 1318

## 2014-12-02 IMAGING — CT CT HEAD W/O CM
2 series · 16 of 30 positions shown, 20 images · non-contrast
Comparison: None.

CLINICAL DATA: Dizziness, hypertension.

EXAM:
CT HEAD WITHOUT CONTRAST
TECHNIQUE: Contiguous axial images were obtained from the base of the skull
through the vertex without intravenous contrast.

[Series 2: head w/o · axial · non-contrast · 0.43mm/px · z∈[-87,+53]mm · 13 of 32 slices shown, 17 images]
[im 3/32  brain]
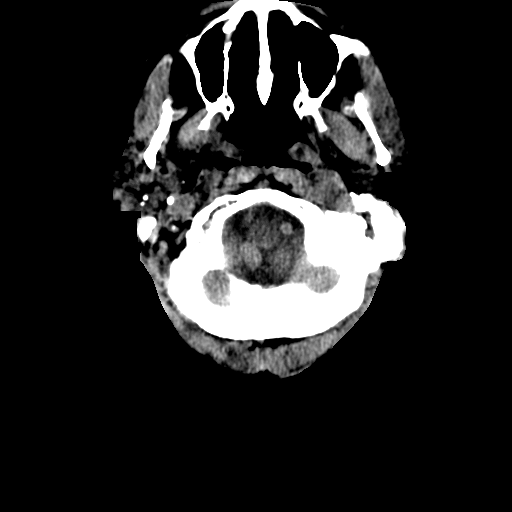
[im 3/32  bone]
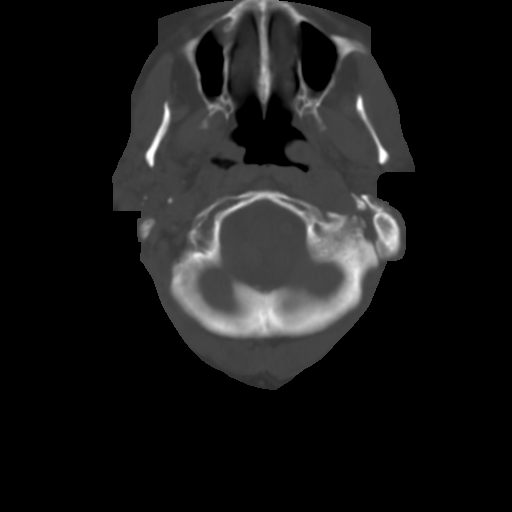
[im 5/32  brain]
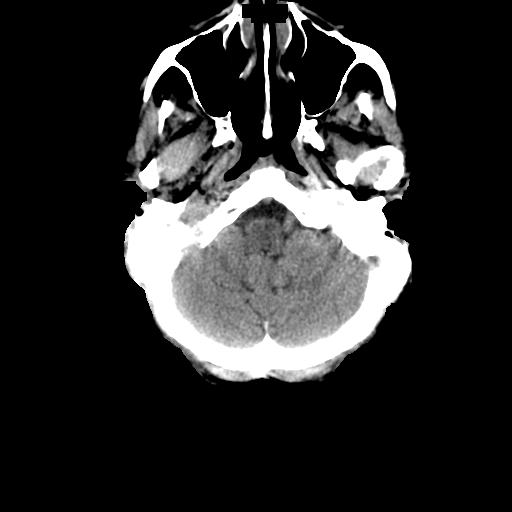
[im 7/32  brain]
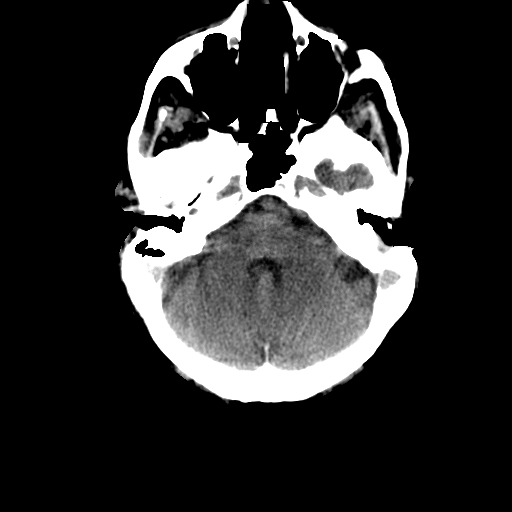
[im 9/32  brain]
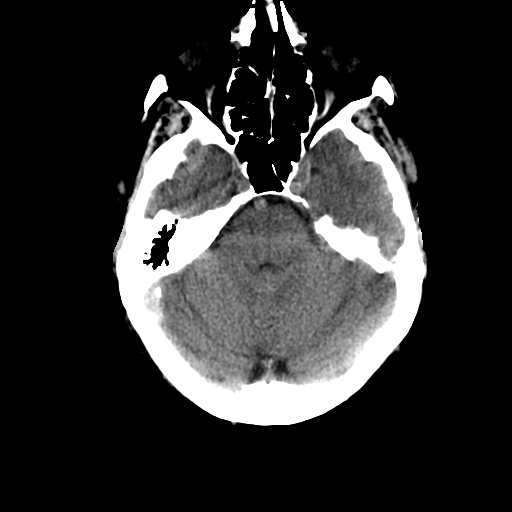
[im 12/32  brain]
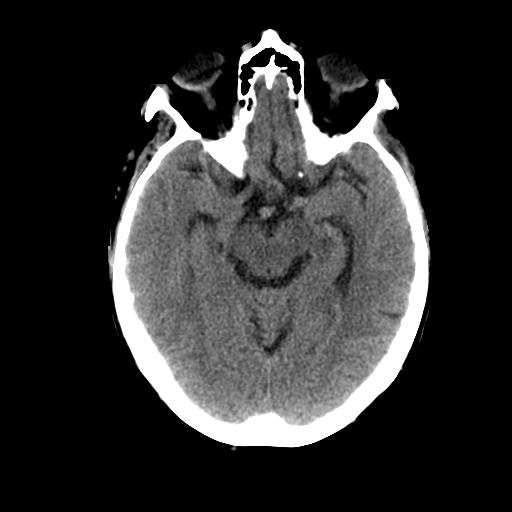
[im 12/32  bone]
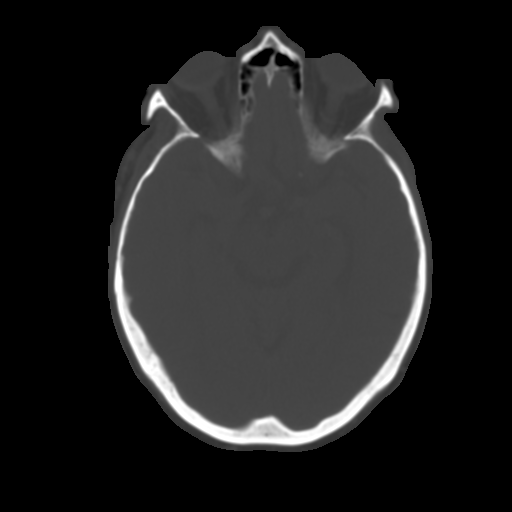
[im 14/32  brain]
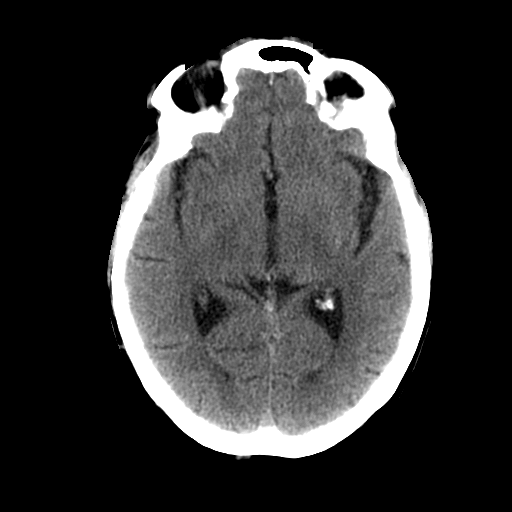
[im 16/32  brain]
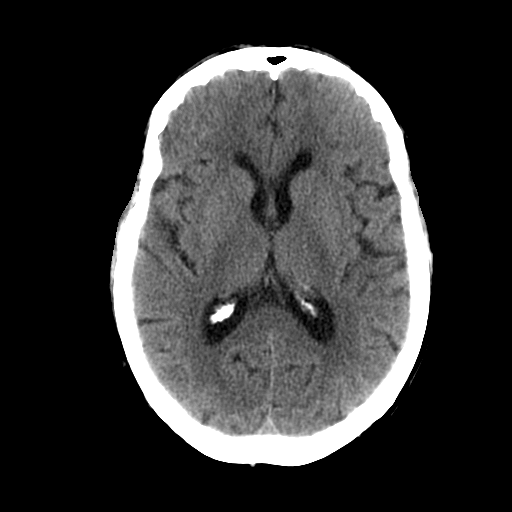
[im 18/32  brain]
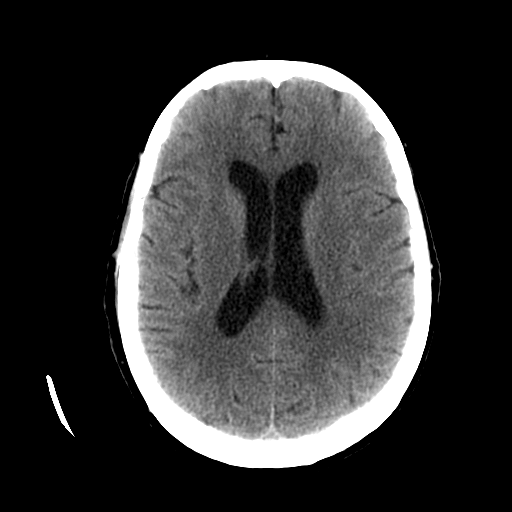
[im 20/32  brain]
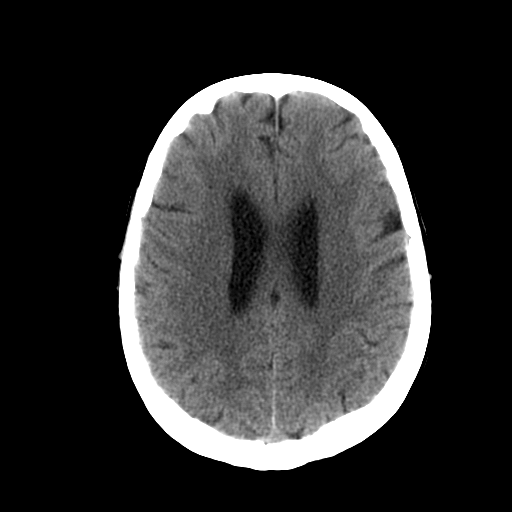
[im 20/32  bone]
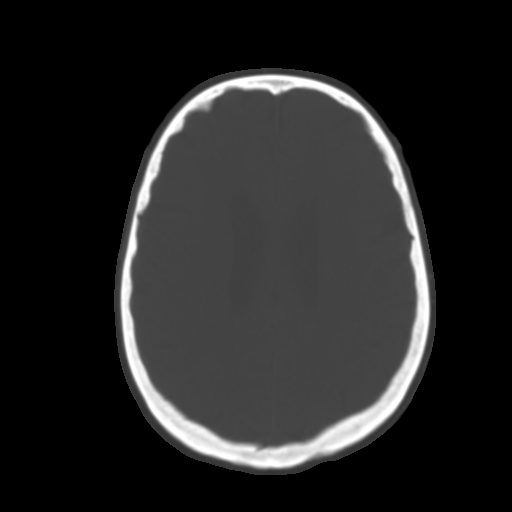
[im 23/32  brain]
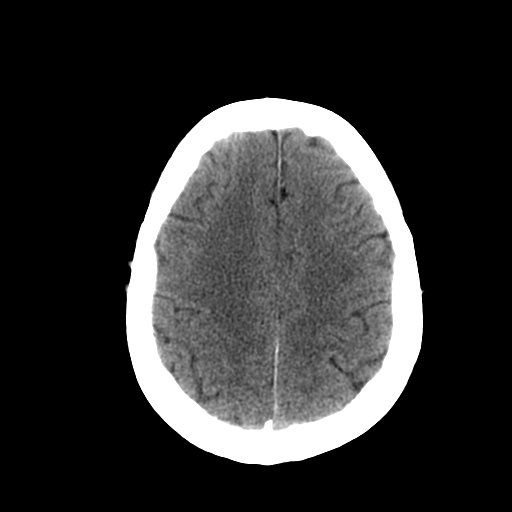
[im 25/32  brain]
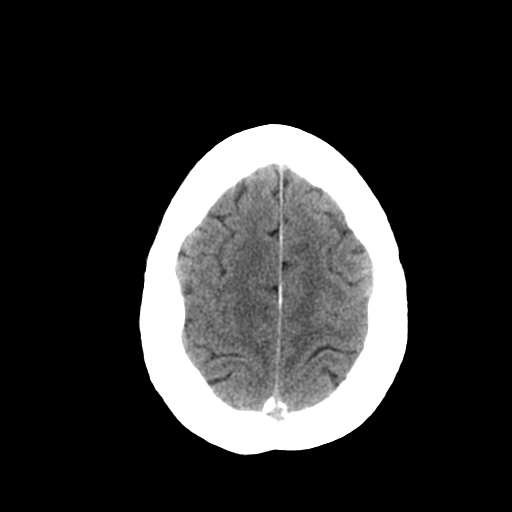
[im 27/32  brain]
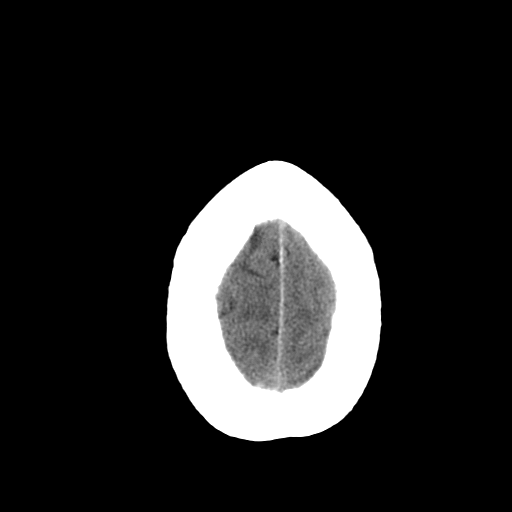
[im 29/32  brain]
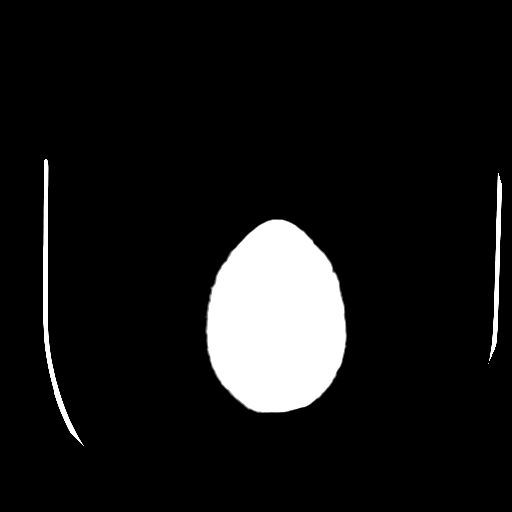
[im 29/32  bone]
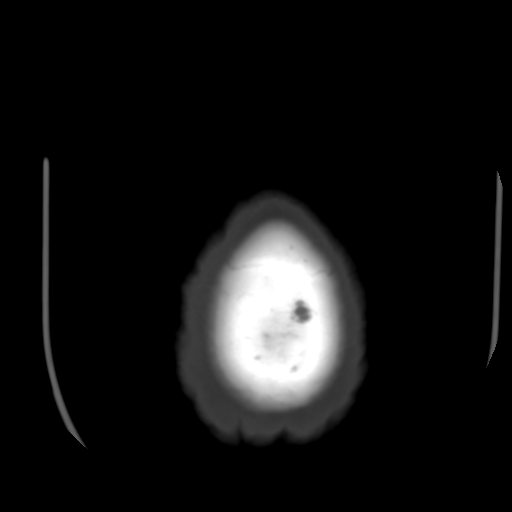

[Series 3: head bone · axial · 0.43mm/px · z∈[-87,-39]mm · 3 of 32 slices shown]
[im 3/32  bone]
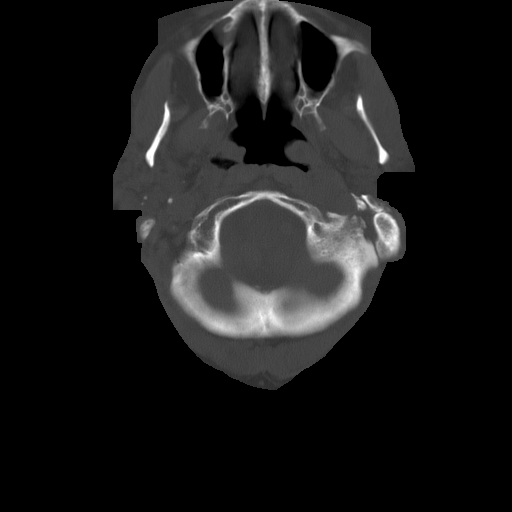
[im 7/32  bone]
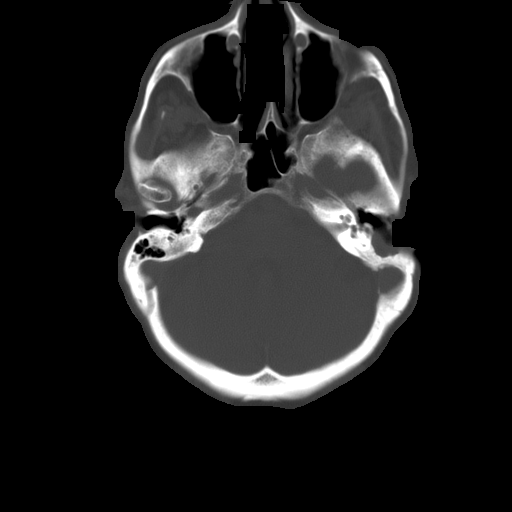
[im 12/32  bone]
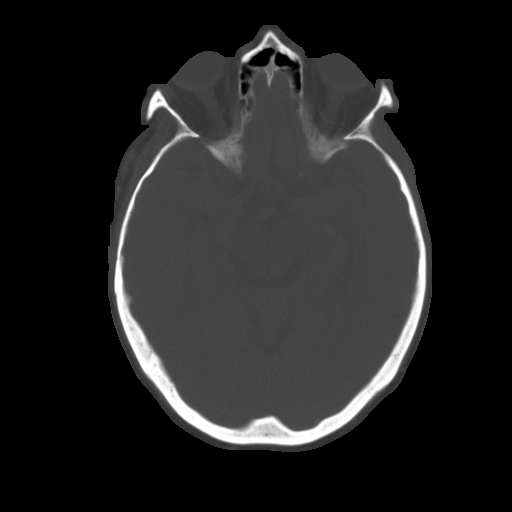

[16 of 30 positions shown; findings below may reference images not displayed]

FINDINGS: The bony calvarium appears intact. Chronic ischemic white matter
disease is noted. No mass effect or midline shift is noted.
Ventricular size is within normal limits. There is no evidence of
mass lesion, hemorrhage or acute infarction.
IMPRESSION: Chronic ischemic white matter disease. No acute intracranial
abnormality seen.

## 2015-09-20 ENCOUNTER — Telehealth: Payer: Self-pay | Admitting: Internal Medicine

## 2015-09-20 NOTE — Telephone Encounter (Signed)
New message     Calling to make a safety report.  Pt is in the hosp with with flu.  She takes spiriva.  If you need to call back, please refer to case # [id number]

## 2015-09-20 NOTE — Telephone Encounter (Signed)
I attempted to call Olivia Massey back at the # listed below. I ended up leaving a confidential voice mail stating that this is not out patient here. It looks as though she might see Dr. Francene FindersSteven R. Klein ([place]

## 2017-07-18 DEATH — deceased

## 2020-01-16 DEATH — deceased
